# Patient Record
Sex: Male | Born: 2000 | Race: White | Hispanic: No
Health system: Southern US, Community
[De-identification: ages and names within clinical notes are randomized; demographics above are authoritative.]

## PROBLEM LIST (undated history)

## (undated) HISTORY — PX: TYMPANOSTOMY TUBE PLACEMENT: SHX32

---
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
## 2020-04-01 ENCOUNTER — Telehealth: Payer: BC Managed Care – PPO | Admitting: Family

## 2020-08-09 ENCOUNTER — Institutional Professional Consult (permissible substitution): Payer: BC Managed Care – PPO | Admitting: Family

## 2020-10-17 ENCOUNTER — Telehealth: Payer: BC Managed Care – PPO | Admitting: Family
